# Patient Record
Sex: Female | Born: 1985
Health system: Southern US, Community
[De-identification: ages and names within clinical notes are randomized; demographics above are authoritative.]

## PROBLEM LIST (undated history)

## (undated) ENCOUNTER — Inpatient Hospital Stay (HOSPITAL_COMMUNITY): Payer: Self-pay

## (undated) DIAGNOSIS — Z789 Other specified health status: Secondary | ICD-10-CM

## (undated) HISTORY — PX: NO PAST SURGERIES: SHX2092

---
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
## 2004-12-02 ENCOUNTER — Emergency Department (HOSPITAL_COMMUNITY): Admission: EM | Admit: 2004-12-02 | Discharge: 2004-12-02 | Payer: Self-pay | Admitting: Emergency Medicine

## 2004-12-25 ENCOUNTER — Emergency Department (HOSPITAL_COMMUNITY): Admission: EM | Admit: 2004-12-25 | Discharge: 2004-12-25 | Payer: Self-pay | Admitting: Emergency Medicine

## 2006-04-18 ENCOUNTER — Other Ambulatory Visit: Admission: RE | Admit: 2006-04-18 | Discharge: 2006-04-18 | Payer: Self-pay | Admitting: Obstetrics and Gynecology

## 2009-11-11 ENCOUNTER — Inpatient Hospital Stay (HOSPITAL_COMMUNITY): Admission: AD | Admit: 2009-11-11 | Discharge: 2009-11-11 | Payer: Self-pay | Admitting: Obstetrics and Gynecology

## 2009-11-11 DIAGNOSIS — O4700 False labor before 37 completed weeks of gestation, unspecified trimester: Secondary | ICD-10-CM

## 2010-01-07 ENCOUNTER — Inpatient Hospital Stay (HOSPITAL_COMMUNITY): Admission: AD | Admit: 2010-01-07 | Discharge: 2010-01-10 | Payer: Self-pay | Admitting: Obstetrics & Gynecology

## 2010-06-24 LAB — CBC
HCT: 27.6 % — ABNORMAL LOW (ref 36.0–46.0)
Hemoglobin: 9.4 g/dL — ABNORMAL LOW (ref 12.0–15.0)
Hemoglobin: 11.7 g/dL — ABNORMAL LOW (ref 12.0–15.0)
MCH: 30.4 pg (ref 26.0–34.0)
MCH: 30.1 pg (ref 26.0–34.0)
MCHC: 34.1 g/dL (ref 30.0–36.0)
MCHC: 34.1 g/dL (ref 30.0–36.0)
MCV: 89.1 fL (ref 78.0–100.0)
MCV: 88.3 fL (ref 78.0–100.0)
Platelets: 144 10*3/uL — ABNORMAL LOW (ref 150–400)
RBC: 3.1 MIL/uL — ABNORMAL LOW (ref 3.87–5.11)
RBC: 3.87 MIL/uL (ref 3.87–5.11)
RDW: 14.1 % (ref 11.5–15.5)
WBC: 11.1 10*3/uL — ABNORMAL HIGH (ref 4.0–10.5)

## 2011-05-04 IMAGING — US US OB TRANSVAGINAL
1 series · 14 of 21 positions shown · non-contrast
Comparison: none

OBSTETRICAL ULTRASOUND:
 This ultrasound exam was performed in the [HOSPITAL] Ultrasound Department.  The OB US report was generated in the AS system, and faxed to the ordering physician.  This report is also available in [HOSPITAL]?s AccessANYware and in [REDACTED] PACS.

[Series 1: us ob transvaginal · 0.25mm/px · 14 of 21 slices shown]
[im 1/21]
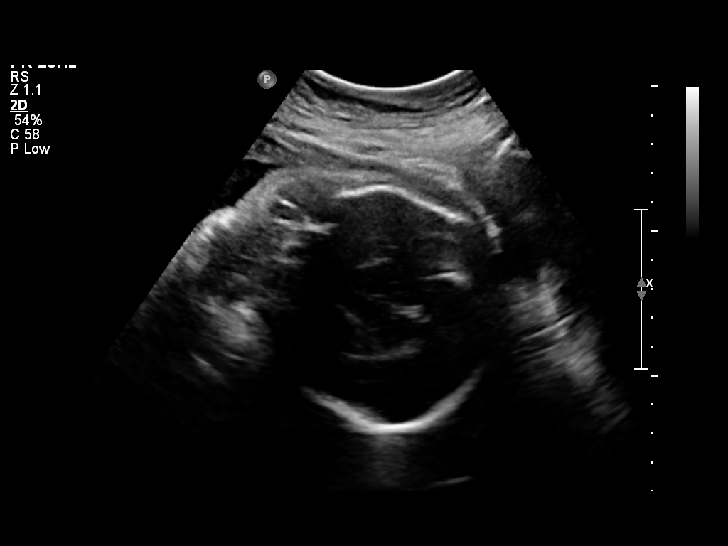
[im 3/21]
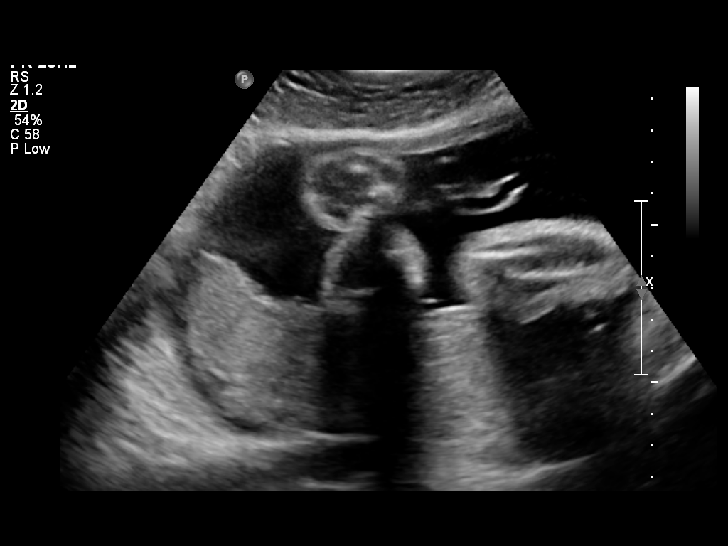
[im 4/21]
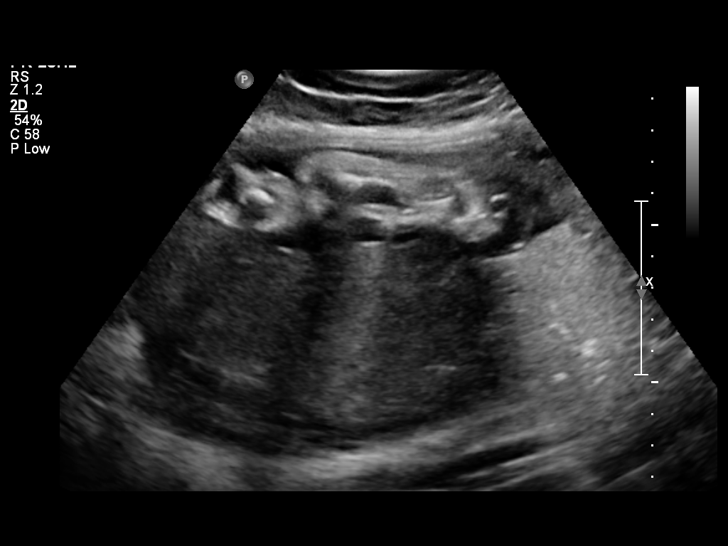
[im 6/21]
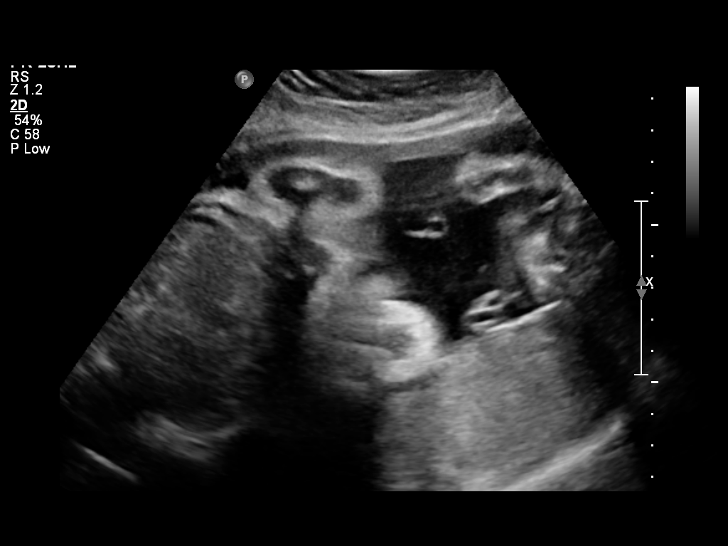
[im 7/21]
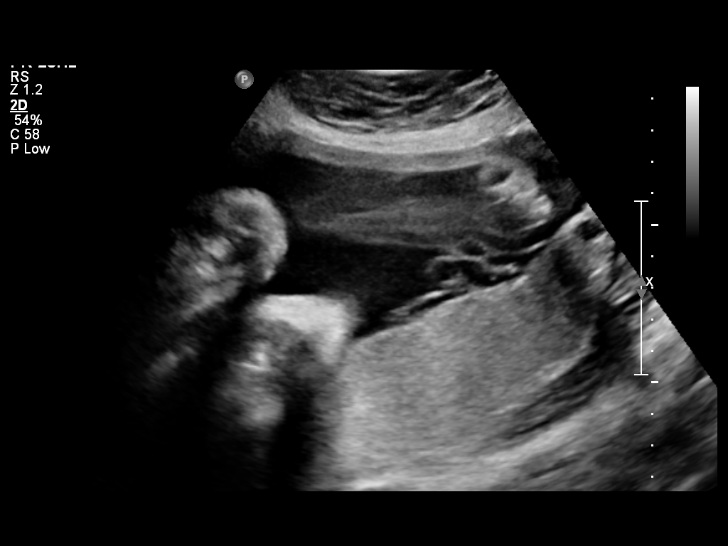
[im 9/21]
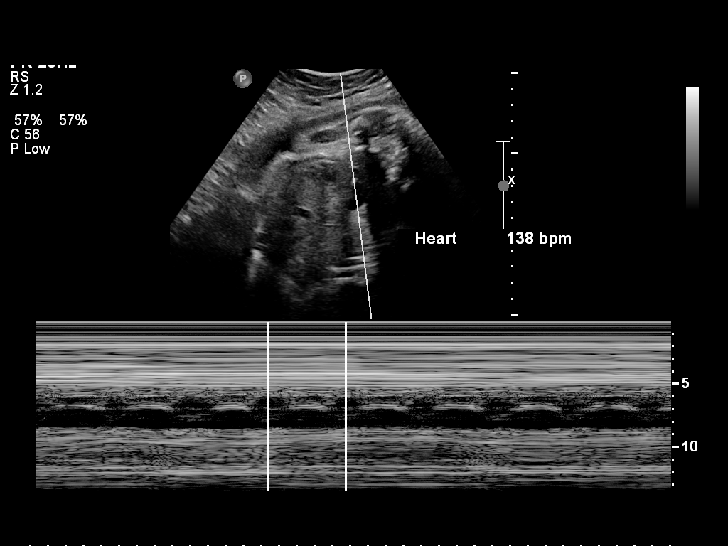
[im 10/21]
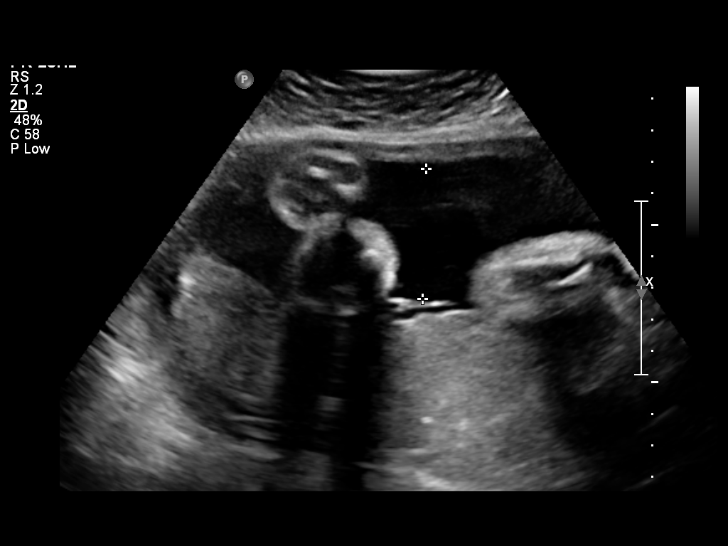
[im 12/21]
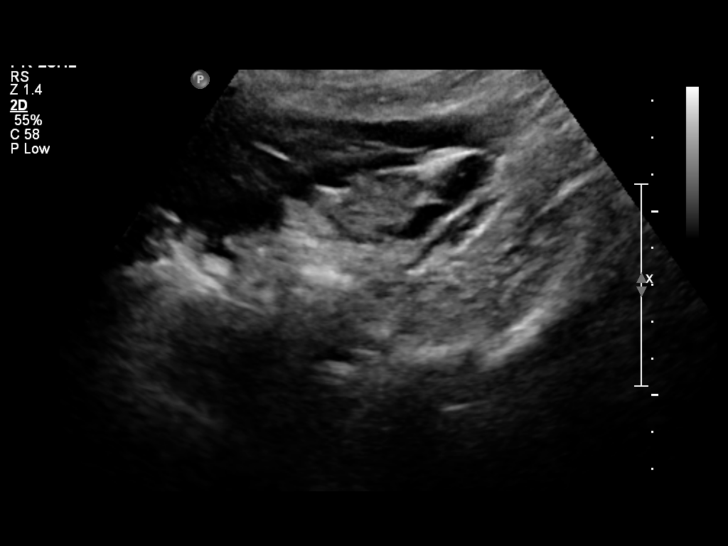
[im 13/21]
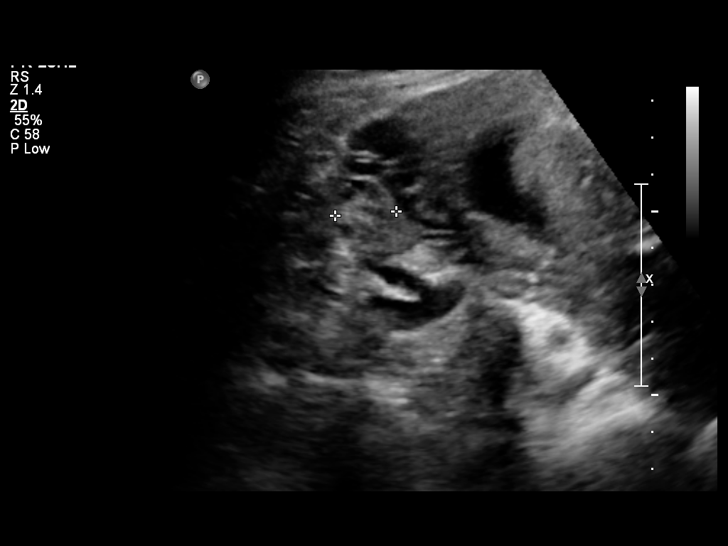
[im 15/21]
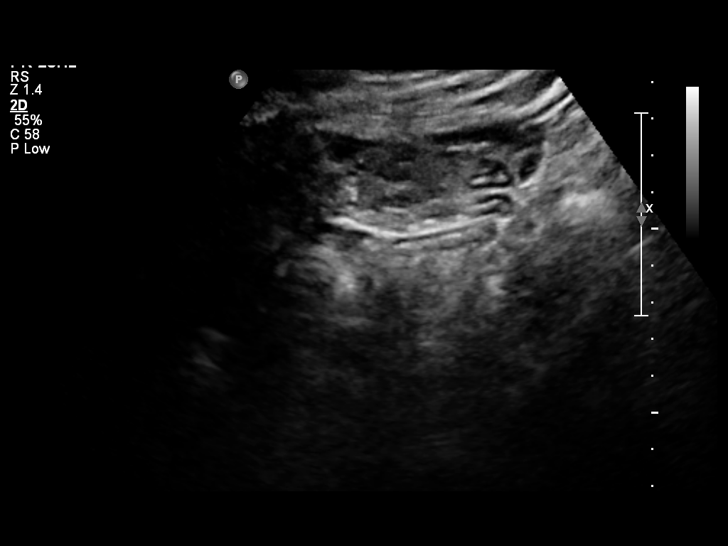
[im 16/21]
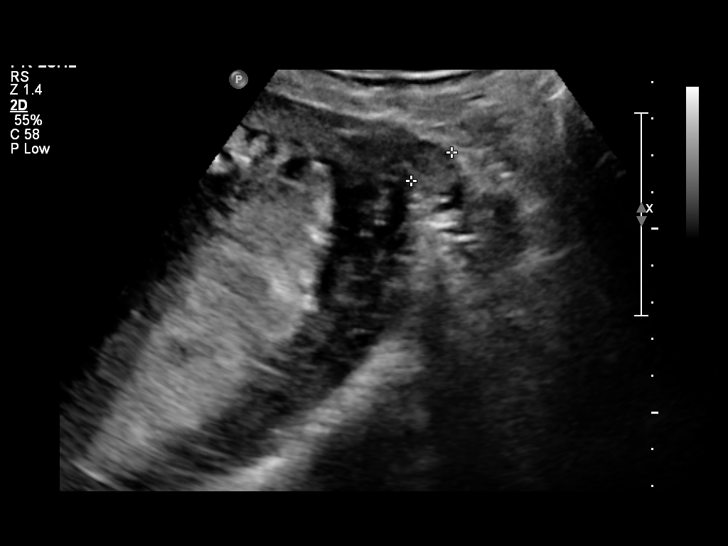
[im 18/21]
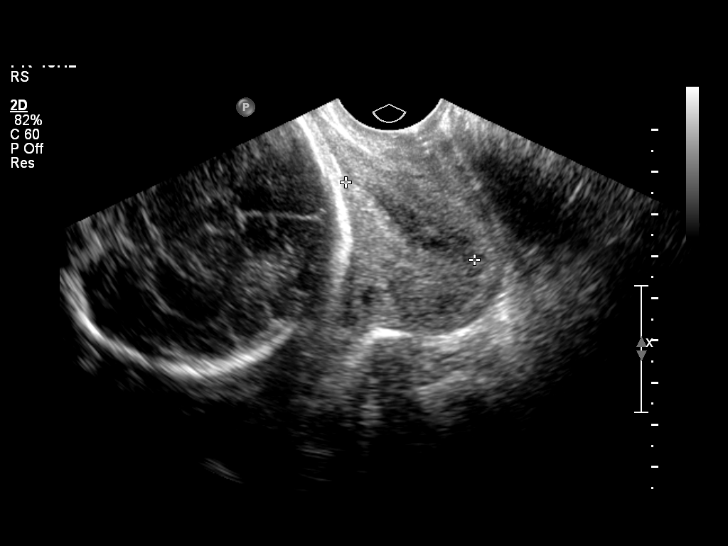
[im 19/21]
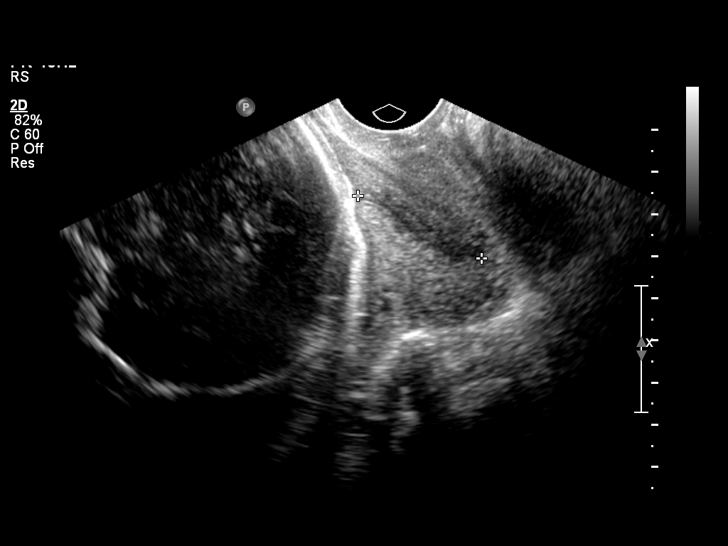
[im 21/21]
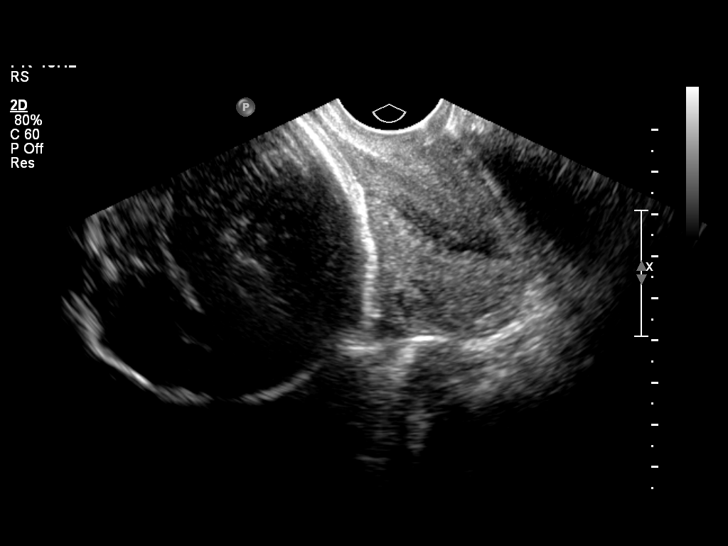

[14 of 21 positions shown; findings below may reference images not displayed]

IMPRESSION: See AS Obstetric US report.

## 2015-04-01 LAB — OB RESULTS CONSOLE GC/CHLAMYDIA
CHLAMYDIA, DNA PROBE: NEGATIVE
GC PROBE AMP, GENITAL: NEGATIVE

## 2015-04-12 NOTE — L&D Delivery Note (Signed)
Patient was C/C/+2 and pushed for <20 minutes with epidural.   NSVD female infant, Apgars 8/9, weight P.   The patient had periurethral and left labial repaired with 3-0, 2nd degree laceration repaired with 2-0. Fundus was firm. EBL was expected amount. Placenta was delivered intact. Vagina was clear.  Baby was vigorous and doing skin to skin with mother.  Philip Aspen

## 2015-04-21 LAB — OB RESULTS CONSOLE RUBELLA ANTIBODY, IGM: Rubella: IMMUNE

## 2015-04-21 LAB — OB RESULTS CONSOLE ABO/RH: RH Type: POSITIVE

## 2015-04-21 LAB — OB RESULTS CONSOLE RPR: RPR: NONREACTIVE

## 2015-04-21 LAB — OB RESULTS CONSOLE HEPATITIS B SURFACE ANTIGEN: HEP B S AG: NEGATIVE

## 2015-04-21 LAB — OB RESULTS CONSOLE ANTIBODY SCREEN: ANTIBODY SCREEN: NEGATIVE

## 2015-04-21 LAB — OB RESULTS CONSOLE HIV ANTIBODY (ROUTINE TESTING): HIV: NONREACTIVE

## 2015-09-21 ENCOUNTER — Inpatient Hospital Stay (HOSPITAL_COMMUNITY)
Admission: AD | Admit: 2015-09-21 | Discharge: 2015-09-21 | Disposition: A | Discharge status: Home / Self Care | Payer: 59 | Source: Ambulatory Visit | Attending: Obstetrics and Gynecology | Admitting: Obstetrics and Gynecology

## 2015-09-21 ENCOUNTER — Encounter (HOSPITAL_COMMUNITY): Payer: Self-pay | Admitting: *Deleted

## 2015-09-21 DIAGNOSIS — O4703 False labor before 37 completed weeks of gestation, third trimester: Secondary | ICD-10-CM | POA: Diagnosis not present

## 2015-09-21 DIAGNOSIS — Z3A33 33 weeks gestation of pregnancy: Secondary | ICD-10-CM | POA: Diagnosis not present

## 2015-09-21 HISTORY — DX: Other specified health status: Z78.9

## 2015-09-21 LAB — URINALYSIS, ROUTINE W REFLEX MICROSCOPIC
Bilirubin Urine: NEGATIVE
Glucose, UA: NEGATIVE mg/dL
KETONES UR: NEGATIVE mg/dL
Nitrite: NEGATIVE
PROTEIN: NEGATIVE mg/dL
Specific Gravity, Urine: <1.005 — ABNORMAL LOW (ref 1.005–1.030)
pH: 6.5 (ref 5.0–8.0)

## 2015-09-21 LAB — URINE MICROSCOPIC-ADD ON

## 2015-09-21 LAB — FETAL FIBRONECTIN: FETAL FIBRONECTIN: NEGATIVE

## 2015-09-21 MED ORDER — LACTATED RINGERS IV BOLUS (SEPSIS)
1000.0000 mL | Freq: Once | INTRAVENOUS | Status: AC
  Administered 2015-09-21: 1000 mL via INTRAVENOUS

## 2015-09-21 MED ORDER — NIFEDIPINE 10 MG PO CAPS
10.0000 mg | ORAL_CAPSULE | ORAL | Status: AC | PRN
  Administered 2015-09-21 (×3): 10 mg via ORAL
  Filled 2015-09-21 (×3): qty 1

## 2015-09-21 NOTE — Discharge Instructions (Signed)
Preterm Labor Information °Preterm labor is when labor starts at less than 37 weeks of pregnancy. The normal length of a pregnancy is 39 to 41 weeks. °CAUSES °Often, there is no identifiable underlying cause as to why a woman goes into preterm labor. One of the most common known causes of preterm labor is infection. Infections of the uterus, cervix, vagina, amniotic sac, bladder, kidney, or even the lungs (pneumonia) can cause labor to start. Other suspected causes of preterm labor include:  °· Urogenital infections, such as yeast infections and bacterial vaginosis.   °· Uterine abnormalities (uterine shape, uterine septum, fibroids, or bleeding from the placenta).   °· A cervix that has been operated on (it may fail to stay closed).   °· Malformations in the fetus.   °· Multiple gestations (twins, triplets, and so on).   °· Breakage of the amniotic sac.   °RISK FACTORS °1. Having a previous history of preterm labor.   °2. Having premature rupture of membranes (PROM).   °3. Having a placenta that covers the opening of the cervix (placenta previa).   °4. Having a placenta that separates from the uterus (placental abruption).   °5. Having a cervix that is too weak to hold the fetus in the uterus (incompetent cervix).   °6. Having too much fluid in the amniotic sac (polyhydramnios).   °7. Taking illegal drugs or smoking while pregnant.   °8. Not gaining enough weight while pregnant.   °9. Being younger than 18 and older than 32 years old.   °10. Having a low socioeconomic status.   °11. Being African American. °SYMPTOMS °Signs and symptoms of preterm labor include:  °· Menstrual-like cramps, abdominal pain, or back pain. °· Uterine contractions that are regular, as frequent as six in an hour, regardless of their intensity (may be mild or painful). °· Contractions that start on the top of the uterus and spread down to the lower abdomen and back.   °· A sense of increased pelvic pressure.   °· A watery or bloody mucus  discharge that comes from the vagina.   °TREATMENT °Depending on the length of the pregnancy and other circumstances, your health care provider may suggest bed rest. If necessary, there are medicines that can be given to stop contractions and to mature the fetal lungs. If labor happens before 34 weeks of pregnancy, a prolonged hospital stay may be recommended. Treatment depends on the condition of both you and the fetus.  °WHAT SHOULD YOU DO IF YOU THINK YOU ARE IN PRETERM LABOR? °Call your health care provider right away. You will need to go to the hospital to get checked immediately. °HOW CAN YOU PREVENT PRETERM LABOR IN FUTURE PREGNANCIES? °You should:  °· Stop smoking if you smoke.  °· Maintain healthy weight gain and avoid chemicals and drugs that are not necessary. °· Be watchful for any type of infection. °· Inform your health care provider if you have a known history of preterm labor. °  °This information is not intended to replace advice given to you by your health care provider. Make sure you discuss any questions you have with your health care provider. °  °Document Released: 06/18/2003 Document Revised: 11/28/2012 Document Reviewed: 04/30/2012 °Elsevier Interactive Patient Education ©2016 [employer] °Fetal Movement Counts °Patient Name: __________________________________________________ Patient Due Date: ____________________ °Performing a fetal movement count is highly recommended in high-risk pregnancies, but it is good for every pregnant woman to do. Your health care provider may ask you to start counting fetal movements at 28 weeks of the pregnancy. Fetal movements often increase: °· After eating a full meal. °·   After physical activity. °· After eating or drinking something sweet or cold. °· At rest. °Pay attention to when you feel the baby is most active. This will help you notice a pattern of your baby's sleep and wake cycles and what factors contribute to an increase in fetal movement. It is  important to perform a fetal movement count at the same time each day when your baby is normally most active.  °HOW TO COUNT FETAL MOVEMENTS °12. Find a quiet and comfortable area to sit or lie down on your left side. Lying on your left side provides the best blood and oxygen circulation to your baby. °13. Write down the day and time on a sheet of paper or in a journal. °14. Start counting kicks, flutters, swishes, rolls, or jabs in a 2-hour period. You should feel at least 10 movements within 2 hours. °15. If you do not feel 10 movements in 2 hours, wait 2-3 hours and count again. Look for a change in the pattern or not enough counts in 2 hours. °SEEK MEDICAL CARE IF: °· You feel less than 10 counts in 2 hours, tried twice. °· There is no movement in over an hour. °· The pattern is changing or taking longer each day to reach 10 counts in 2 hours. °· You feel the baby is not moving as he or she usually does. °Date: ____________ Movements: ____________ Start time: ____________ Finish time: ____________  °Date: ____________ Movements: ____________ Start time: ____________ Finish time: ____________ °Date: ____________ Movements: ____________ Start time: ____________ Finish time: ____________ °Date: ____________ Movements: ____________ Start time: ____________ Finish time: ____________ °Date: ____________ Movements: ____________ Start time: ____________ Finish time: ____________ °Date: ____________ Movements: ____________ Start time: ____________ Finish time: ____________ °Date: ____________ Movements: ____________ Start time: ____________ Finish time: ____________ °Date: ____________ Movements: ____________ Start time: ____________ Finish time: ____________  °Date: ____________ Movements: ____________ Start time: ____________ Finish time: ____________ °Date: ____________ Movements: ____________ Start time: ____________ Finish time: ____________ °Date: ____________ Movements: ____________ Start time: ____________ Finish  time: ____________ °Date: ____________ Movements: ____________ Start time: ____________ Finish time: ____________ °Date: ____________ Movements: ____________ Start time: ____________ Finish time: ____________ °Date: ____________ Movements: ____________ Start time: ____________ Finish time: ____________ °Date: ____________ Movements: ____________ Start time: ____________ Finish time: ____________  °Date: ____________ Movements: ____________ Start time: ____________ Finish time: ____________ °Date: ____________ Movements: ____________ Start time: ____________ Finish time: ____________ °Date: ____________ Movements: ____________ Start time: ____________ Finish time: ____________ °Date: ____________ Movements: ____________ Start time: ____________ Finish time: ____________ °Date: ____________ Movements: ____________ Start time: ____________ Finish time: ____________ °Date: ____________ Movements: ____________ Start time: ____________ Finish time: ____________ °Date: ____________ Movements: ____________ Start time: ____________ Finish time: ____________  °Date: ____________ Movements: ____________ Start time: ____________ Finish time: ____________ °Date: ____________ Movements: ____________ Start time: ____________ Finish time: ____________ °Date: ____________ Movements: ____________ Start time: ____________ Finish time: ____________ °Date: ____________ Movements: ____________ Start time: ____________ Finish time: ____________ °Date: ____________ Movements: ____________ Start time: ____________ Finish time: ____________ °Date: ____________ Movements: ____________ Start time: ____________ Finish time: ____________ °Date: ____________ Movements: ____________ Start time: ____________ Finish time: ____________  °Date: ____________ Movements: ____________ Start time: ____________ Finish time: ____________ °Date: ____________ Movements: ____________ Start time: ____________ Finish time: ____________ °Date: ____________  Movements: ____________ Start time: ____________ Finish time: ____________ °Date: ____________ Movements: ____________ Start time: ____________ Finish time: ____________ °Date: ____________ Movements: ____________ Start time: ____________ Finish time: ____________ °Date: ____________ Movements: ____________ Start time: ____________ Finish time: ____________ °Date: ____________ Movements: ____________ Start time: ____________   Finish time: ____________  °Date: ____________ Movements: ____________ Start time: ____________ Finish time: ____________ °Date: ____________ Movements: ____________ Start time: ____________ Finish time: ____________ °Date: ____________ Movements: ____________ Start time: ____________ Finish time: ____________ °Date: ____________ Movements: ____________ Start time: ____________ Finish time: ____________ °Date: ____________ Movements: ____________ Start time: ____________ Finish time: ____________ °Date: ____________ Movements: ____________ Start time: ____________ Finish time: ____________ °Date: ____________ Movements: ____________ Start time: ____________ Finish time: ____________  °Date: ____________ Movements: ____________ Start time: ____________ Finish time: ____________ °Date: ____________ Movements: ____________ Start time: ____________ Finish time: ____________ °Date: ____________ Movements: ____________ Start time: ____________ Finish time: ____________ °Date: ____________ Movements: ____________ Start time: ____________ Finish time: ____________ °Date: ____________ Movements: ____________ Start time: ____________ Finish time: ____________ °Date: ____________ Movements: ____________ Start time: ____________ Finish time: ____________ °Date: ____________ Movements: ____________ Start time: ____________ Finish time: ____________  °Date: ____________ Movements: ____________ Start time: ____________ Finish time: ____________ °Date: ____________ Movements: ____________ Start time:  ____________ Finish time: ____________ °Date: ____________ Movements: ____________ Start time: ____________ Finish time: ____________ °Date: ____________ Movements: ____________ Start time: ____________ Finish time: ____________ °Date: ____________ Movements: ____________ Start time: ____________ Finish time: ____________ °Date: ____________ Movements: ____________ Start time: ____________ Finish time: ____________ °  °This information is not intended to replace advice given to you by your health care provider. Make sure you discuss any questions you have with your health care provider. °  °Document Released: 04/27/2006 Document Revised: 04/18/2014 Document Reviewed: 01/23/2012 °Elsevier Interactive Patient Education ©2016 [employer]vier Inc[employer] ° °

## 2015-09-21 NOTE — MAU Note (Signed)
Pains started around 0600, getting stronger, about every 15 min.Marland Kitchen.  No prior hx.  No bleeding or leaking. Having nausea and vomting, started first

## 2015-09-21 NOTE — MAU Provider Note (Signed)
History     CSN: [id number]  Arrival date and time: 09/21/15 1127  First Provider Initiated Contact with Patient 09/21/15 1353        Chief Complaint  Patient presents with  . Contractions   HPI Krista Riley is a 30 y.o. G2P1001 at [redacted]w[redacted]d who presents with contractions. Reports vomiting 3 times this morning. Since then has felt contractions every 15 minutes. Ctx are not consistently painful. Most recent ctx rates 6/10 on pain scale. Denies vaginal bleeding or LOF. Positive fetal movement. Denies history of preterm labor. No intercourse in the last 24 hours.   OB History    Gravida Para Term Preterm AB TAB SAB Ectopic Multiple Living   Past Medical History  Diagnosis Date  . Medical history non-contributory     Past Surgical History  Procedure Laterality Date  . No past surgeries      History reviewed. No pertinent family history.  Social History  Substance Use Topics  . Smoking status: Never Smoker   . Smokeless tobacco: None  . Alcohol Use: No    Allergies: Allergies not on file  No prescriptions prior to admission    Review of Systems  Constitutional: Negative.   Gastrointestinal: Positive for abdominal pain. Negative for nausea, vomiting (no nausea or vomiting since early this morning), diarrhea and constipation.  Genitourinary: Negative.    Physical Exam   Blood pressure 118/65, pulse 105, temperature 98 F (36.7 C), temperature source Oral, resp. rate 16.  Physical Exam  Nursing note and vitals reviewed. Constitutional: She is oriented to person, place, and time. She appears well-developed and well-nourished. No distress.  HENT:  Head: Normocephalic and atraumatic.  Eyes: Conjunctivae are normal. Right eye exhibits no discharge. Left eye exhibits no discharge. No scleral icterus.  Neck: Normal range of motion.  Cardiovascular: Normal rate, regular rhythm and normal heart sounds.   No murmur heard. Respiratory: Effort normal  and breath sounds normal. No respiratory distress. She has no wheezes.  GI: Soft. There is no tenderness.  Ctx palpate moderate  Neurological: She is alert and oriented to person, place, and time.  Skin: Skin is warm and dry. She is not diaphoretic.  Psychiatric: She has a normal mood and affect. Her behavior is normal. Judgment and thought content normal.   Dilation: 1 Effacement (%): 40 Cervical Position: Posterior Station: -3 Exam by:: Judeth Horn NP  Fetal Tracing:  Baseline: 125 Variability: moderate Accelerations: 15x15 Decelerations: none  Toco: initially 5-6 mins; irregular UI s/p procardia   MAU Course  Procedures Results for orders placed or performed during the hospital encounter of 09/21/15 (from the past 24 hour(s))  Urinalysis, Routine w reflex microscopic (not at [facility])     Status: Abnormal   Collection Time: 09/21/15 11:56 AM  Result Value Ref Range   Color, Urine YELLOW YELLOW   APPearance CLEAR CLEAR   Specific Gravity, Urine <1.005 (L) 1.005 - 1.030   pH 6.5 5.0 - 8.0   Glucose, UA NEGATIVE NEGATIVE mg/dL   Hgb urine dipstick TRACE (A) NEGATIVE   Bilirubin Urine NEGATIVE NEGATIVE   Ketones, ur NEGATIVE NEGATIVE mg/dL   Protein, ur NEGATIVE NEGATIVE mg/dL   Nitrite NEGATIVE NEGATIVE   Leukocytes, UA LARGE (A) NEGATIVE  Urine microscopic-add on     Status: Abnormal   Collection Time: 09/21/15 11:56 AM  Result Value Ref Range   Squamous Epithelial / LPF 6-30 (  A) NONE SEEN   WBC, UA 6-30 0 - 5 WBC/hpf   RBC / HPF 0-5 0 - 5 RBC/hpf   Bacteria, UA MANY (A) NONE SEEN  Fetal fibronectin     Status: None   Collection Time: 09/21/15 12:18 PM  Result Value Ref Range   Fetal Fibronectin NEGATIVE NEGATIVE    MDM Reactive tracing FFN negative IV fluid bolus Procardia given -- contractions decreased to irregular UI SVE unchanged after 1+ hour S/w Dr. Claiborne Billingsallahan. Ok to discharge home with preterm labor return precautions.   Assessment and Plan  A:  1.  Preterm contractions, third trimester     P: Discharge home Preterm labor precautions Fetal kick count form Keep f/u with OB  Judeth HornErin Laryn Venning 09/21/2015, 12:10 PM

## 2015-10-05 LAB — OB RESULTS CONSOLE GBS: GBS: POSITIVE

## 2015-11-03 ENCOUNTER — Other Ambulatory Visit: Payer: Self-pay | Admitting: Obstetrics and Gynecology

## 2015-11-03 ENCOUNTER — Encounter (HOSPITAL_COMMUNITY): Payer: Self-pay

## 2015-11-03 ENCOUNTER — Inpatient Hospital Stay (HOSPITAL_COMMUNITY)
Admission: AD | Admit: 2015-11-03 | Discharge: 2015-11-05 | DRG: 775 | Disposition: A | Discharge status: Home / Self Care | Payer: 59 | Source: Ambulatory Visit | Attending: Obstetrics and Gynecology | Admitting: Obstetrics and Gynecology

## 2015-11-03 DIAGNOSIS — Z3A40 40 weeks gestation of pregnancy: Secondary | ICD-10-CM | POA: Diagnosis not present

## 2015-11-03 DIAGNOSIS — O4292 Full-term premature rupture of membranes, unspecified as to length of time between rupture and onset of labor: Secondary | ICD-10-CM | POA: Diagnosis present

## 2015-11-03 DIAGNOSIS — O4290 Premature rupture of membranes, unspecified as to length of time between rupture and onset of labor, unspecified weeks of gestation: Secondary | ICD-10-CM | POA: Diagnosis present

## 2015-11-03 LAB — POCT FERN TEST: POCT Fern Test: POSITIVE

## 2015-11-03 NOTE — Progress Notes (Signed)
Message left requesting call back.

## 2015-11-03 NOTE — Progress Notes (Signed)
Notified of pt arrival in MAU and exam with positive fern. Admit pt to labor and delivery.

## 2015-11-03 NOTE — MAU Note (Signed)
Pt presents complaining of SROM at 2200. Denies bleeding. Reports good fetal movement. Irregular contractions. Pt was 4cm in office this am.

## 2015-11-04 ENCOUNTER — Inpatient Hospital Stay (HOSPITAL_COMMUNITY): Payer: 59 | Admitting: Anesthesiology

## 2015-11-04 ENCOUNTER — Encounter (HOSPITAL_COMMUNITY): Payer: Self-pay | Admitting: *Deleted

## 2015-11-04 DIAGNOSIS — O4290 Premature rupture of membranes, unspecified as to length of time between rupture and onset of labor, unspecified weeks of gestation: Secondary | ICD-10-CM | POA: Diagnosis present

## 2015-11-04 LAB — CBC
HEMATOCRIT: 31.8 % — AB (ref 36.0–46.0)
Hemoglobin: 10.3 g/dL — ABNORMAL LOW (ref 12.0–15.0)
MCH: 27.4 pg (ref 26.0–34.0)
MCHC: 32.4 g/dL (ref 30.0–36.0)
MCV: 84.6 fL (ref 78.0–100.0)
PLATELETS: 185 10*3/uL (ref 150–400)
RBC: 3.76 MIL/uL — AB (ref 3.87–5.11)
RDW: 14.8 % (ref 11.5–15.5)
WBC: 10.8 10*3/uL — AB (ref 4.0–10.5)

## 2015-11-04 LAB — TYPE AND SCREEN
ABO/RH(D): O POS
Antibody Screen: NEGATIVE

## 2015-11-04 LAB — RPR: RPR: NONREACTIVE

## 2015-11-04 LAB — ABO/RH: ABO/RH(D): O POS

## 2015-11-04 MED ORDER — DIPHENHYDRAMINE HCL 25 MG PO CAPS: 25.0000 mg | ORAL_CAPSULE | Freq: Four times a day (QID) | ORAL | Status: DC | PRN

## 2015-11-04 MED ORDER — PENICILLIN G POTASSIUM 5000000 UNITS IJ SOLR
2.5000 10*6.[IU] | INTRAVENOUS | Status: DC
  Administered 2015-11-04 (×2): 2.5 10*6.[IU] via INTRAVENOUS
  Filled 2015-11-04 (×3): qty 2.5

## 2015-11-04 MED ORDER — SIMETHICONE 80 MG PO CHEW: 80.0000 mg | CHEWABLE_TABLET | ORAL | Status: DC | PRN

## 2015-11-04 MED ORDER — LACTATED RINGERS IV SOLN: 500.0000 mL | Freq: Once | INTRAVENOUS | Status: DC

## 2015-11-04 MED ORDER — ONDANSETRON HCL 4 MG/2ML IJ SOLN: 4.0000 mg | INTRAMUSCULAR | Status: DC | PRN

## 2015-11-04 MED ORDER — LIDOCAINE HCL (PF) 1 % IJ SOLN
INTRAMUSCULAR | Status: DC | PRN
  Administered 2015-11-04 (×2): 4 mL

## 2015-11-04 MED ORDER — OXYCODONE-ACETAMINOPHEN 5-325 MG PO TABS: 1.0000 | ORAL_TABLET | ORAL | Status: DC | PRN

## 2015-11-04 MED ORDER — DIBUCAINE 1 % RE OINT: 1.0000 "application " | TOPICAL_OINTMENT | RECTAL | Status: DC | PRN

## 2015-11-04 MED ORDER — DIPHENHYDRAMINE HCL 50 MG/ML IJ SOLN: 12.5000 mg | INTRAMUSCULAR | Status: DC | PRN

## 2015-11-04 MED ORDER — PHENYLEPHRINE 40 MCG/ML (10ML) SYRINGE FOR IV PUSH (FOR BLOOD PRESSURE SUPPORT)
80.0000 ug | PREFILLED_SYRINGE | INTRAVENOUS | Status: DC | PRN
  Filled 2015-11-04: qty 5

## 2015-11-04 MED ORDER — TETANUS-DIPHTH-ACELL PERTUSSIS 5-2.5-18.5 LF-MCG/0.5 IM SUSP: 0.5000 mL | Freq: Once | INTRAMUSCULAR | Status: DC

## 2015-11-04 MED ORDER — DEXTROSE 5 % IV SOLN
5.0000 10*6.[IU] | Freq: Once | INTRAVENOUS | Status: AC
  Administered 2015-11-04: 5 10*6.[IU] via INTRAVENOUS
  Filled 2015-11-04: qty 5

## 2015-11-04 MED ORDER — IBUPROFEN 600 MG PO TABS
600.0000 mg | ORAL_TABLET | Freq: Four times a day (QID) | ORAL | Status: DC
  Administered 2015-11-04 – 2015-11-05 (×5): 600 mg via ORAL
  Filled 2015-11-04 (×5): qty 1

## 2015-11-04 MED ORDER — FLEET ENEMA 7-19 GM/118ML RE ENEM: 1.0000 | ENEMA | RECTAL | Status: DC | PRN

## 2015-11-04 MED ORDER — PRENATAL MULTIVITAMIN CH
1.0000 | ORAL_TABLET | Freq: Every day | ORAL | Status: DC
  Administered 2015-11-05: 1 via ORAL
  Filled 2015-11-04: qty 1

## 2015-11-04 MED ORDER — SENNOSIDES-DOCUSATE SODIUM 8.6-50 MG PO TABS
2.0000 | ORAL_TABLET | ORAL | Status: DC
  Administered 2015-11-04: 2 via ORAL
  Filled 2015-11-04: qty 2

## 2015-11-04 MED ORDER — ONDANSETRON HCL 4 MG/2ML IJ SOLN
4.0000 mg | Freq: Four times a day (QID) | INTRAMUSCULAR | Status: DC | PRN
  Administered 2015-11-04: 4 mg via INTRAVENOUS
  Filled 2015-11-04: qty 2

## 2015-11-04 MED ORDER — OXYTOCIN 40 UNITS IN LACTATED RINGERS INFUSION - SIMPLE MED
2.5000 [IU]/h | INTRAVENOUS | Status: DC
  Administered 2015-11-04: 2.5 [IU]/h via INTRAVENOUS
  Filled 2015-11-04: qty 1000

## 2015-11-04 MED ORDER — LACTATED RINGERS IV SOLN: 500.0000 mL | INTRAVENOUS | Status: DC | PRN

## 2015-11-04 MED ORDER — WITCH HAZEL-GLYCERIN EX PADS: 1.0000 "application " | MEDICATED_PAD | CUTANEOUS | Status: DC | PRN

## 2015-11-04 MED ORDER — ZOLPIDEM TARTRATE 5 MG PO TABS: 5.0000 mg | ORAL_TABLET | Freq: Every evening | ORAL | Status: DC | PRN

## 2015-11-04 MED ORDER — EPHEDRINE 5 MG/ML INJ
10.0000 mg | INTRAVENOUS | Status: DC | PRN
  Filled 2015-11-04: qty 4

## 2015-11-04 MED ORDER — PHENYLEPHRINE 40 MCG/ML (10ML) SYRINGE FOR IV PUSH (FOR BLOOD PRESSURE SUPPORT)
80.0000 ug | PREFILLED_SYRINGE | INTRAVENOUS | Status: DC | PRN
  Filled 2015-11-04: qty 10
  Filled 2015-11-04: qty 5

## 2015-11-04 MED ORDER — BENZOCAINE-MENTHOL 20-0.5 % EX AERO
1.0000 "application " | INHALATION_SPRAY | CUTANEOUS | Status: DC | PRN
  Administered 2015-11-04: 1 via TOPICAL
  Filled 2015-11-04: qty 56

## 2015-11-04 MED ORDER — LACTATED RINGERS IV SOLN
INTRAVENOUS | Status: DC
  Administered 2015-11-04 (×2): via INTRAVENOUS

## 2015-11-04 MED ORDER — SOD CITRATE-CITRIC ACID 500-334 MG/5ML PO SOLN: 30.0000 mL | ORAL | Status: DC | PRN

## 2015-11-04 MED ORDER — FENTANYL 2.5 MCG/ML BUPIVACAINE 1/10 % EPIDURAL INFUSION (WH - ANES)
14.0000 mL/h | INTRAMUSCULAR | Status: DC | PRN
  Administered 2015-11-04 (×2): 14 mL/h via EPIDURAL
  Filled 2015-11-04: qty 125

## 2015-11-04 MED ORDER — LIDOCAINE HCL (PF) 1 % IJ SOLN
30.0000 mL | INTRAMUSCULAR | Status: DC | PRN
  Filled 2015-11-04: qty 30

## 2015-11-04 MED ORDER — ONDANSETRON HCL 4 MG PO TABS: 4.0000 mg | ORAL_TABLET | ORAL | Status: DC | PRN

## 2015-11-04 MED ORDER — OXYCODONE-ACETAMINOPHEN 5-325 MG PO TABS: 2.0000 | ORAL_TABLET | ORAL | Status: DC | PRN

## 2015-11-04 MED ORDER — ACETAMINOPHEN 325 MG PO TABS: 650.0000 mg | ORAL_TABLET | ORAL | Status: DC | PRN

## 2015-11-04 MED ORDER — OXYTOCIN BOLUS FROM INFUSION
500.0000 mL | Freq: Once | INTRAVENOUS | Status: AC
  Administered 2015-11-04: 500 mL via INTRAVENOUS

## 2015-11-04 MED ORDER — COCONUT OIL OIL: 1.0000 "application " | TOPICAL_OIL | Status: DC | PRN

## 2015-11-04 NOTE — Discharge Summary (Signed)
Obstetric Discharge Summary Reason for Admission: rupture of membranes Prenatal Procedures: none Intrapartum Procedures: spontaneous vaginal delivery Postpartum Procedures: none Complications-Operative and Postpartum: 2 degree perineal laceration Hemoglobin  Date Value Ref Range Status  11/03/2015 10.3 (L) 12.0 - 15.0 g/dL Final   HCT  Date Value Ref Range Status  11/03/2015 31.8 (L) 36.0 - 46.0 % Final    Discharge Diagnoses: Term Pregnancy-delivered  Discharge Information: Date: 11/04/2015 Activity: pelvic rest Diet: routine Medications: Ibuprofen Condition: stable Instructions: refer to practice specific booklet Discharge to: home Follow-up Information    CALLAHAN, SIDNEY, DO Follow up in 4 day(s).   Specialty:  Obstetrics and Gynecology Contact information: [address] [place] [address] [phone]           Newborn Data: Live born female  Birth Weight: 9 lb 12.8 oz (4445 g) APGAR: 8, 9  Home with mother.  Rozetta Stumpp A 11/04/2015, 8:28 PM

## 2015-11-04 NOTE — Anesthesia Postprocedure Evaluation (Signed)
Anesthesia Post Note  Patient: Krista Riley  Procedure(s) Performed: * No procedures listed *  Patient location during evaluation: Mother Baby Anesthesia Type: Epidural Level of consciousness: awake, awake and alert, oriented and patient cooperative Pain management: pain level controlled Vital Signs Assessment: post-procedure vital signs reviewed and stable Respiratory status: spontaneous breathing, nonlabored ventilation and respiratory function stable Cardiovascular status: stable Postop Assessment: no headache, patient able to bend at knees, no backache and no signs of nausea or vomiting Anesthetic complications: no     Last Vitals:  Vitals:   11/04/15 1105 11/04/15 1300  BP: 125/85 130/74  Pulse: 96 95  Resp: 20 20  Temp: 36.9 C 36.8 C    Last Pain:  Vitals:   11/04/15 1300  TempSrc: Oral  PainSc:    Pain Goal: Patients Stated Pain Goal: 8 (11/04/15 0005)               Dannon Perlow L

## 2015-11-04 NOTE — Anesthesia Procedure Notes (Signed)
Epidural Patient location during procedure: OB  Staffing Anesthesiologist: Jamani Eley Performed: anesthesiologist   Preanesthetic Checklist Completed: patient identified, site marked, surgical consent, pre-op evaluation, timeout performed, IV checked, risks and benefits discussed and monitors and equipment checked  Epidural Patient position: sitting Prep: site prepped and draped and DuraPrep Patient monitoring: continuous pulse ox and blood pressure Approach: midline Location: L3-L4 Injection technique: LOR saline  Needle:  Needle type: Tuohy  Needle gauge: 17 G Needle length: 9 cm and 9 Needle insertion depth: 6 cm Catheter type: closed end flexible Catheter size: 19 Gauge Catheter at skin depth: 10 cm Test dose: negative  Assessment Events: blood not aspirated, injection not painful, no injection resistance, negative IV test and no paresthesia  Additional Notes Patient identified. Risks/Benefits/Options discussed with patient including but not limited to bleeding, infection, nerve damage, paralysis, failed block, incomplete pain control, headache, blood pressure changes, nausea, vomiting, reactions to medication both or allergic, itching and postpartum back pain. Confirmed with bedside nurse the patient's most recent platelet count. Confirmed with patient that they are not currently taking any anticoagulation, have any bleeding history or any family history of bleeding disorders. Patient expressed understanding and wished to proceed. All questions were answered. Sterile technique was used throughout the entire procedure. Please see nursing notes for vital signs. Test dose was given through epidural catheter and negative prior to continuing to dose epidural or start infusion. Warning signs of high block given to the patient including shortness of breath, tingling/numbness in hands, complete motor block, or any concerning symptoms with instructions to call for help. Patient was given  instructions on fall risk and not to get out of bed. All questions and concerns addressed with instructions to call with any issues or inadequate analgesia.        

## 2015-11-04 NOTE — Lactation Note (Signed)
This note was copied from a baby's chart. Lactation Consultation Note  Patient Name: Boy Emilyn Avera [id number] Date: 11/04/2015 Reason for consult: Initial assessment Baby at 6 hr of life and mom reports bf is going well. She denies breast or nipple pain, voiced no concerns. She bf her daughter well in the hospital . Once they got home she got engorged and started pumping to feed. Over time her supply dropped. Discussed baby behavior, feeding frequency, baby belly size, voids, wt loss, breast changes, pre/post pumping as needed, and nipple care. She stated she can manually express and has spoon in room. Given lactation handouts. Aware of OP services and support group. She will call as needed.     Maternal Data Has patient been taught Hand Expression?: Yes Does the patient have breastfeeding experience prior to this delivery?: Yes  Feeding Feeding Type: Breast Fed Length of feed: 10 min  LATCH Score/Interventions Latch: Grasps breast easily, tongue down, lips flanged, rhythmical sucking.  Audible Swallowing: A few with stimulation  Type of Nipple: Everted at rest and after stimulation  Comfort (Breast/Nipple): Soft / non-tender     Hold (Positioning): No assistance needed to correctly position infant at breast. Intervention(s): Breastfeeding basics reviewed  LATCH Score: 9  Lactation Tools Discussed/Used WIC Program: No   Consult Status Consult Status: Follow-up Date: 11/05/15 Follow-up type: In-patient    Rulon Eisenmenger 11/04/2015, 4:20 PM

## 2015-11-04 NOTE — Anesthesia Pain Management Evaluation Note (Signed)
  CRNA Pain Management Visit Note  Patient: Krista Riley, 30 y.o., female  "Hello I am a member of the anesthesia team at [facility]. We have an anesthesia team available at all times to provide care throughout the hospital, including epidural management and anesthesia for C-section. I don't know your plan for the delivery whether it a natural birth, water birth, IV sedation, nitrous supplementation, doula or epidural, but we want to meet your pain goals."   1.Was your pain managed to your expectations on prior hospitalizations?   Yes   2.What is your expectation for pain management during this hospitalization?     Epidural  3.How can we help you reach that goal?  Encouragement thru labor  Record the patient's initial score and the patient's pain goal.   Pain: 1  Pain Goal: 6 [facility] wants you to be able to say your pain was always managed very well.  Revia Nghiem 11/04/2015

## 2015-11-04 NOTE — Anesthesia Preprocedure Evaluation (Signed)
Anesthesia Evaluation  Patient identified by MRN, date of birth, ID band Patient awake    Reviewed: Allergy & Precautions, NPO status , Patient's Chart, lab work & pertinent test results  History of Anesthesia Complications Negative for: history of anesthetic complications  Airway Mallampati: II  TM Distance: >3 FB Neck ROM: Full    Dental no notable dental hx.    Pulmonary neg pulmonary ROS,    Pulmonary exam normal breath sounds clear to auscultation       Cardiovascular negative cardio ROS Normal cardiovascular exam Rhythm:Regular Rate:Normal     Neuro/Psych negative neurological ROS  negative psych ROS   GI/Hepatic negative GI ROS, Neg liver ROS,   Endo/Other  obesity  Renal/GU negative Renal ROS  negative genitourinary   Musculoskeletal negative musculoskeletal ROS (+)   Abdominal   Peds negative pediatric ROS (+)  Hematology negative hematology ROS (+)   Anesthesia Other Findings   Reproductive/Obstetrics (+) Pregnancy                             Anesthesia Physical Anesthesia Plan  ASA: II  Anesthesia Plan: Epidural   Post-op Pain Management:    Induction:   Airway Management Planned:   Additional Equipment:   Intra-op Plan:   Post-operative Plan:   Informed Consent: I have reviewed the patients History and Physical, chart, labs and discussed the procedure including the risks, benefits and alternatives for the proposed anesthesia with the patient or authorized representative who has indicated his/her understanding and acceptance.   Dental advisory given  Plan Discussed with: CRNA  Anesthesia Plan Comments:         Anesthesia Quick Evaluation

## 2015-11-04 NOTE — H&P (Signed)
Krista Riley is an 30 y.o. G2P1001 [redacted]w[redacted]d female who presents to the ER with SROM. She had + pool in the ER, [facility] was uncomplicated   Chief Complaint: HPI:  Past Medical History:  Diagnosis Date  . Medical history non-contributory     Past Surgical History:  Procedure Laterality Date  . NO PAST SURGERIES      History reviewed. No pertinent family history. Social History:  reports that she has never smoked. She has never used smokeless tobacco. She reports that she does not drink alcohol or use drugs.  Allergies: No Known Allergies  Medications Prior to Admission  Medication Sig Dispense Refill  . acetaminophen (TYLENOL) 500 MG tablet Take 500 mg by mouth every 6 (six) hours as needed for moderate pain.    Marland Kitchen omeprazole (PRILOSEC OTC) 20 MG tablet Take 20 mg by mouth daily.    . Prenatal Vit-Fe Fumarate-FA (MULTIVITAMIN-PRENATAL) 27-0.8 MG TABS tablet Take 1 tablet by mouth daily at 12 noon.         Temperature 98 F (36.7 C), temperature source Oral, height 5\' 6"  (1.676 m), weight 207 lb (93.9 kg). General appearance: alert and cooperative Lungs: clear to auscultation bilaterally Abdomen: soft, non-tender; bowel sounds normal; no masses,  no organomegaly,gravid   Lab Results  Component Value Date   WBC 10.8 (H) 11/03/2015   HGB 10.3 (L) 11/03/2015   HCT 31.8 (L) 11/03/2015   MCV 84.6 11/03/2015   PLT 185 11/03/2015   No results found for: PREGTESTUR, PREGSERUM, HCG, HCGQUANT    Patient Active Problem List   Diagnosis Date Noted  . ROM (rupture of membranes), premature 11/04/2015   IMP/ IUP at term with SROM Plan/ Admit  Cylas Falzone E 11/04/2015, 12:45 AM

## 2015-11-05 ENCOUNTER — Ambulatory Visit: Payer: Self-pay

## 2015-11-05 LAB — CBC
HEMATOCRIT: 28.2 % — AB (ref 36.0–46.0)
HEMOGLOBIN: 9 g/dL — AB (ref 12.0–15.0)
MCH: 27.2 pg (ref 26.0–34.0)
MCHC: 31.9 g/dL (ref 30.0–36.0)
MCV: 85.2 fL (ref 78.0–100.0)
Platelets: 153 10*3/uL (ref 150–400)
RBC: 3.31 MIL/uL — AB (ref 3.87–5.11)
RDW: 15 % (ref 11.5–15.5)
WBC: 10.5 10*3/uL (ref 4.0–10.5)

## 2015-11-05 NOTE — Lactation Note (Signed)
This note was copied from a baby's chart. Lactation Consultation Note  Patient Name: Krista Riley [id number] Date: 11/05/2015 Reason for consult: Follow-up assessment  Baby 29 hours old. Mom attempting to latch baby in a modified side-lying position. Mom seemed very uncomfortable, and baby not maintaining a latch. Assisted mom with repositioning and assisted with latching to baby to the left breast in football position. Baby latched deeply and suckled rhythmically with a few swallows noted. Demonstrated how to flange baby's lower lip and mom reported increased comfort. Mom states that baby was sleepy in the early morning, and did not want to nurse. Mom reports that baby was sleepy after the circumcision this morning as well. Enc mom to offer lots of STS and nurse often. Mom states that she has been able to easily express colostrum and her breasts are starting to feel fuller. Mom states that she had mastitis with first baby. Discussed milk coming to volume and enc nursing often. Maternal Data    Feeding Feeding Type: Breast Fed Length of feed:  (LC assessed the first 10 minutes of BF.)  LATCH Score/Interventions Latch: Grasps breast easily, tongue down, lips flanged, rhythmical sucking.  Audible Swallowing: A few with stimulation Intervention(s): Skin to skin  Type of Nipple: Everted at rest and after stimulation  Comfort (Breast/Nipple): Soft / non-tender     Hold (Positioning): Assistance needed to correctly position infant at breast and maintain latch. Intervention(s): Breastfeeding basics reviewed;Support Pillows;Position options;Skin to skin  LATCH Score: 8  Lactation Tools Discussed/Used     Consult Status Consult Status: Follow-up Date: 11/06/15 Follow-up type: In-patient    Geralynn Ochs 11/05/2015, 3:07 PM

## 2015-11-05 NOTE — Progress Notes (Signed)
Patient is eating, ambulating, voiding.  Pain control is good.  Vitals:   11/04/15 1105 11/04/15 1300 11/04/15 1718 11/05/15 0544  BP: 125/85 130/74 115/73 125/67  Pulse: 96 95 72 86  Resp: 20 20 20 20   Temp: 98.4 F (36.9 C) 98.2 F (36.8 C) 98.4 F (36.9 C) 97.6 F (36.4 C)  TempSrc: Oral Oral Axillary Oral  SpO2:      Weight:      Height:        Fundus firm Perineum without swelling.  Lab Results  Component Value Date   WBC 10.5 11/05/2015   HGB 9.0 (L) 11/05/2015   HCT 28.2 (L) 11/05/2015   MCV 85.2 11/05/2015   PLT 153 11/05/2015    --/--/O POS, O POS (07/25 2340)/RI  A/P Post partum day 1.  Routine care.  Expect d/c routine.    Sharde Gover A

## 2015-11-06 ENCOUNTER — Ambulatory Visit: Payer: Self-pay

## 2015-11-06 NOTE — Lactation Note (Addendum)
This note was copied from a baby's chart. Lactation Consultation Note  Patient Name: Krista Riley [id number] Date: 11/06/2015 Reason for consult: Follow-up assessment;Infant weight loss;Other (Comment) (6% weight loss )  Baby  Is 48 hours old and has been to the breast consistently with latch scores 8-10 's . Per mom abby seems satisfied after feedings.  Voids ans stools adequate for age .  Per mom concerned she may get sore like her 1st baby .  Sore nipple and engorgement prevention and tx reviewed. And mom instructed on the use shells .  LC assessed breast tissue with moms permission - LC noted some bruising on the front surface of both nipples.  No breakdown and color pink , some areola edema, semi compressible areolas. LC reviewed hand expressing , steady flow milk noted And then reverse pressure. Areola more compressible. LC also instructed mom on the use shells.  Per mom has DEBp at home.  Mother informed of post-discharge support and given phone number to the lactation department, including services for phone call assistance; out-patient appointments; and breastfeeding support group. List of other breastfeeding resources in the community given in the handout. Encouraged mother to call for problems or concerns related to breastfeeding.   Maternal Data    Feeding Feeding Type:  (per mom recently breast fed ) Length of feed: 30 min  LATCH Score/Interventions                Intervention(s): Breastfeeding basics reviewed     Lactation Tools Discussed/Used Tools: Shells Shell Type: Inverted WIC Program: No   Consult Status Consult Status: Complete Date: 11/06/15    Kathrin Greathouse 11/06/2015, 9:34 AM

## 2015-11-10 ENCOUNTER — Inpatient Hospital Stay (HOSPITAL_COMMUNITY): Payer: 59

## 2023-06-26 ENCOUNTER — Other Ambulatory Visit: Payer: Self-pay | Admitting: Obstetrics and Gynecology

## 2023-06-26 DIAGNOSIS — R928 Other abnormal and inconclusive findings on diagnostic imaging of breast: Secondary | ICD-10-CM

## 2023-06-28 ENCOUNTER — Ambulatory Visit
Admission: RE | Admit: 2023-06-28 | Discharge: 2023-06-28 | Discharge status: Home / Self Care | Source: Ambulatory Visit | Attending: Obstetrics and Gynecology | Admitting: Obstetrics and Gynecology

## 2023-06-28 ENCOUNTER — Ambulatory Visit
Admission: RE | Admit: 2023-06-28 | Discharge: 2023-06-28 | Disposition: A | Discharge status: Home / Self Care | Source: Ambulatory Visit | Attending: Obstetrics and Gynecology | Admitting: Obstetrics and Gynecology

## 2023-06-28 ENCOUNTER — Other Ambulatory Visit: Payer: Self-pay | Admitting: Obstetrics and Gynecology

## 2023-06-28 DIAGNOSIS — R928 Other abnormal and inconclusive findings on diagnostic imaging of breast: Secondary | ICD-10-CM

## 2023-06-28 DIAGNOSIS — N6320 Unspecified lump in the left breast, unspecified quadrant: Secondary | ICD-10-CM

## 2023-06-28 DIAGNOSIS — N6310 Unspecified lump in the right breast, unspecified quadrant: Secondary | ICD-10-CM

## 2023-07-04 ENCOUNTER — Ambulatory Visit
Admission: RE | Admit: 2023-07-04 | Discharge: 2023-07-04 | Disposition: A | Discharge status: Home / Self Care | Source: Ambulatory Visit | Attending: Obstetrics and Gynecology | Admitting: Obstetrics and Gynecology

## 2023-07-04 DIAGNOSIS — N6320 Unspecified lump in the left breast, unspecified quadrant: Secondary | ICD-10-CM

## 2023-07-04 DIAGNOSIS — N6323 Unspecified lump in the left breast, lower outer quadrant: Secondary | ICD-10-CM

## 2023-07-04 DIAGNOSIS — N6310 Unspecified lump in the right breast, unspecified quadrant: Secondary | ICD-10-CM

## 2023-07-04 HISTORY — PX: BREAST BIOPSY: SHX20

## 2023-07-05 LAB — SURGICAL PATHOLOGY

## 2023-12-20 ENCOUNTER — Other Ambulatory Visit: Payer: Self-pay | Admitting: Obstetrics and Gynecology

## 2023-12-20 DIAGNOSIS — R928 Other abnormal and inconclusive findings on diagnostic imaging of breast: Secondary | ICD-10-CM
